# Patient Record
Sex: Male | Born: 1995 | Race: Black or African American | Hispanic: No | Marital: Single
Health system: Southern US, Community
[De-identification: ages and names within clinical notes are randomized; demographics above are authoritative.]

---
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
## 2019-12-09 ENCOUNTER — Ambulatory Visit: Payer: 59 | Attending: Internal Medicine

## 2019-12-09 DIAGNOSIS — Z23 Encounter for immunization: Secondary | ICD-10-CM

## 2019-12-09 NOTE — Progress Notes (Signed)
   Covid-[facility] Vaccination [facility]  Name:  Angel Aguirre    MRN: [id number] DOB: November 01, 1995  12/09/2019  [name] was observed post Covid-19 immunization for 15 minutes without incident. He was provided with Vaccine Information Sheet and instruction to access the V-Safe system.   [name] was instructed to call 911 with any severe reactions post vaccine: Marland Kitchen Difficulty breathing  . Swelling of face and throat  . A fast heartbeat  . A bad rash all over body  . Dizziness and weakness   Immunizations Administered    Name Date Dose VIS Date Route   Pfizer COVID-19 Vaccine 12/09/2019  4:14 PM 0.3 mL 08/19/2019 Intramuscular   Manufacturer: [employer], [employer]   Lot: [id number]   NDC: [address]-9

## 2020-01-04 ENCOUNTER — Ambulatory Visit: Payer: 59

## 2020-01-10 ENCOUNTER — Ambulatory Visit: Payer: 59
